# Patient Record
Sex: Female | Born: 1960 | Race: White | Hispanic: No
Health system: Southern US, Community
[De-identification: ages and names within clinical notes are randomized; demographics above are authoritative.]

## PROBLEM LIST (undated history)

## (undated) DIAGNOSIS — E119 Type 2 diabetes mellitus without complications: Secondary | ICD-10-CM

## (undated) DIAGNOSIS — R251 Tremor, unspecified: Secondary | ICD-10-CM

## (undated) DIAGNOSIS — I10 Essential (primary) hypertension: Secondary | ICD-10-CM

## (undated) DIAGNOSIS — F319 Bipolar disorder, unspecified: Secondary | ICD-10-CM

## (undated) HISTORY — PX: LITHOTRIPSY: SUR834

## (undated) HISTORY — PX: STENT PLACEMENT VASCULAR (ARMC HX): HXRAD1737

## (undated) HISTORY — PX: ABDOMINAL HYSTERECTOMY: SHX81

## (undated) HISTORY — PX: CARDIAC SURGERY: SHX584

---

## 2018-03-08 ENCOUNTER — Emergency Department: Payer: Medicare Other

## 2018-03-08 ENCOUNTER — Other Ambulatory Visit: Payer: Self-pay

## 2018-03-08 ENCOUNTER — Emergency Department
Admission: EM | Admit: 2018-03-08 | Discharge: 2018-03-08 | Disposition: A | Discharge status: Home / Self Care | Payer: Medicare Other | Attending: Emergency Medicine | Admitting: Emergency Medicine

## 2018-03-08 ENCOUNTER — Encounter: Payer: Self-pay | Admitting: Emergency Medicine

## 2018-03-08 DIAGNOSIS — E119 Type 2 diabetes mellitus without complications: Secondary | ICD-10-CM | POA: Insufficient documentation

## 2018-03-08 DIAGNOSIS — I10 Essential (primary) hypertension: Secondary | ICD-10-CM | POA: Insufficient documentation

## 2018-03-08 DIAGNOSIS — M545 Low back pain: Secondary | ICD-10-CM | POA: Diagnosis present

## 2018-03-08 DIAGNOSIS — N23 Unspecified renal colic: Secondary | ICD-10-CM | POA: Diagnosis not present

## 2018-03-08 DIAGNOSIS — N390 Urinary tract infection, site not specified: Secondary | ICD-10-CM

## 2018-03-08 DIAGNOSIS — R319 Hematuria, unspecified: Secondary | ICD-10-CM | POA: Diagnosis not present

## 2018-03-08 HISTORY — DX: Type 2 diabetes mellitus without complications: E11.9

## 2018-03-08 HISTORY — DX: Tremor, unspecified: R25.1

## 2018-03-08 HISTORY — DX: Essential (primary) hypertension: I10

## 2018-03-08 HISTORY — DX: Bipolar disorder, unspecified: F31.9

## 2018-03-08 LAB — BASIC METABOLIC PANEL
Anion gap: 13 (ref 5–15)
BUN: 16 mg/dL (ref 6–20)
CO2: 21 mmol/L — AB (ref 22–32)
Calcium: 9.4 mg/dL (ref 8.9–10.3)
Chloride: 106 mmol/L (ref 98–111)
Creatinine, Ser: 0.83 mg/dL (ref 0.44–1.00)
GFR calc Af Amer: >60 mL/min (ref >60)
GFR calc non Af Amer: >60 mL/min (ref >60)
GLUCOSE: 145 mg/dL — AB (ref 70–99)
Potassium: 3.8 mmol/L (ref 3.5–5.1)
Sodium: 140 mmol/L (ref 135–145)

## 2018-03-08 LAB — CBC
HEMATOCRIT: 45.2 % (ref 36.0–46.0)
Hemoglobin: 14.8 g/dL (ref 12.0–15.0)
MCH: 29.2 pg (ref 26.0–34.0)
MCHC: 32.7 g/dL (ref 30.0–36.0)
MCV: 89.2 fL (ref 80.0–100.0)
Platelets: 289 10*3/uL (ref 150–400)
RBC: 5.07 MIL/uL (ref 3.87–5.11)
RDW: 13.2 % (ref 11.5–15.5)
WBC: 12.5 10*3/uL — ABNORMAL HIGH (ref 4.0–10.5)
nRBC: 0 % (ref 0.0–0.2)

## 2018-03-08 LAB — URINALYSIS, COMPLETE (UACMP) WITH MICROSCOPIC
Bilirubin Urine: NEGATIVE
Glucose, UA: NEGATIVE mg/dL
Ketones, ur: NEGATIVE mg/dL
LEUKOCYTES UA: NEGATIVE
Nitrite: NEGATIVE
Protein, ur: 100 mg/dL — AB
RBC / HPF: >50 RBC/hpf — ABNORMAL HIGH (ref 0–5)
Specific Gravity, Urine: 1.02 (ref 1.005–1.030)
pH: 6 (ref 5.0–8.0)

## 2018-03-08 MED ORDER — OXYCODONE-ACETAMINOPHEN 5-325 MG PO TABS
2.0000 | ORAL_TABLET | Freq: Once | ORAL | Status: AC
  Administered 2018-03-08: 2 via ORAL
  Filled 2018-03-08: qty 2

## 2018-03-08 MED ORDER — SODIUM CHLORIDE 0.9 % IV SOLN
Freq: Once | INTRAVENOUS | Status: AC
  Administered 2018-03-08: 21:00:00 via INTRAVENOUS

## 2018-03-08 MED ORDER — OXYCODONE-ACETAMINOPHEN 7.5-325 MG PO TABS: 1.0000 | ORAL_TABLET | ORAL | 0 refills | Status: AC | PRN

## 2018-03-08 MED ORDER — HYDROMORPHONE HCL 1 MG/ML IJ SOLN
1.0000 mg | Freq: Once | INTRAMUSCULAR | Status: AC
  Administered 2018-03-08: 1 mg via INTRAVENOUS
  Filled 2018-03-08: qty 1

## 2018-03-08 MED ORDER — SODIUM CHLORIDE 0.9 % IV SOLN
1.0000 g | Freq: Once | INTRAVENOUS | Status: AC
  Administered 2018-03-08: 1 g via INTRAVENOUS
  Filled 2018-03-08: qty 10

## 2018-03-08 MED ORDER — PROMETHAZINE HCL 25 MG/ML IJ SOLN
25.0000 mg | Freq: Once | INTRAMUSCULAR | Status: AC
  Administered 2018-03-08: 25 mg via INTRAVENOUS
  Filled 2018-03-08: qty 1

## 2018-03-08 MED ORDER — CEPHALEXIN 500 MG PO CAPS: 500.0000 mg | ORAL_CAPSULE | Freq: Four times a day (QID) | ORAL | 0 refills | Status: AC

## 2018-03-08 NOTE — ED Notes (Signed)
Pt states that she has been peeing blood today and she has right sided back pain. Family at bedside.

## 2018-03-08 NOTE — ED Triage Notes (Signed)
PT c/o sudden onset of back pain starting last night with hematuria today. PT hx of kidney stones. Denies fever. PT has hx of tremors.

## 2018-03-08 NOTE — ED Provider Notes (Signed)
[facility], [facility] Emergency Department Provider Note       Time seen: ----------------------------------------- 8:54 PM on 03/08/2018 -----------------------------------------   I have reviewed the triage vital signs and the nursing notes.  HISTORY   Chief Complaint Back Pain    HPI Samantha Ware is a 57 y.o. female with a history of Polar disorder, diabetes, hypertension, tremor who presents to the ED for sudden onset of right-sided low back pain started last night with hematuria today.  She does report a history of kidney stones, this feels more dull and less sharp.  She denies fever or dysuria.  Past Medical History:  Diagnosis Date  . Bipolar 1 disorder (HCC)   . Diabetes mellitus without complication (HCC)   . Hypertension   . Tremor     There are no active problems to display for this patient.   Past Surgical History:  Procedure Laterality Date  . ABDOMINAL HYSTERECTOMY    . CARDIAC SURGERY    . LITHOTRIPSY    . STENT PLACEMENT VASCULAR ([facility] HX)      Allergies Patient has no known allergies.  Social History Social History   Tobacco Use  . Smoking status: Not on file  Substance Use Topics  . Alcohol use: Not Currently  . Drug use: Not Currently   Review of Systems Constitutional: Negative for fever. Cardiovascular: Negative for chest pain. Respiratory: Negative for shortness of breath. Gastrointestinal: Positive for right-sided low back pain, negative for abdominal pain Genitourinary: Positive for hematuria Musculoskeletal: Positive for back pain Skin: Negative for rash. Neurological: Negative for headaches, focal weakness or numbness.  All systems negative/normal/unremarkable except as stated in the HPI  ____________________________________________   PHYSICAL EXAM:  VITAL SIGNS: ED Triage Vitals  Enc Vitals Group     BP 03/08/18 1725 (!) 145/90     Pulse Rate 03/08/18 1725 (!) 112     Resp 03/08/18 1725 18     Temp  03/08/18 1725 98.4 F (36.9 C)     Temp Source 03/08/18 1725 Oral     SpO2 03/08/18 1725 98 %     Weight 03/08/18 1726 (!) 335 lb (152 kg)     Height 03/08/18 1726 5\' 8"  (1.727 m)     Head Circumference --      Peak Flow --      Pain Score 03/08/18 1726 8     Pain Loc --      Pain Edu? --      Excl. in GC? --    Constitutional: Alert and oriented.  Mild distress from pain, morbidly obese Eyes: Conjunctivae are normal. Normal extraocular movements. ENT   Head: Normocephalic and atraumatic.   Nose: No congestion/rhinnorhea.   Mouth/Throat: Mucous membranes are moist.   Neck: No stridor. Cardiovascular: Normal rate, regular rhythm. No murmurs, rubs, or gallops. Respiratory: Normal respiratory effort without tachypnea nor retractions. Breath sounds are clear and equal bilaterally. No wheezes/rales/rhonchi. Gastrointestinal: Soft and nontender. Normal bowel sounds.  Right CVA tenderness Musculoskeletal: Nontender with normal range of motion in extremities. No lower extremity tenderness nor edema. Neurologic:  Normal speech and language. No gross focal neurologic deficits are appreciated.  Skin:  Skin is warm, dry and intact. No rash noted. Psychiatric: Mood and affect are normal. Speech and behavior are normal.  ____________________________________________  ED COURSE:  As part of my medical decision making, I reviewed the following data within the electronic MEDICAL RECORD NUMBER History obtained from family if available, nursing notes, old chart and ekg,  as well as notes from prior ED visits. Patient presented for back pain we will assess with labs and imaging as indicated at this time. Clinical Course as of Mar 09 2231  Wynelle LinkSun Mar 08, 2018  2207 Patient was discussed with urology Dr. Alvester MorinBell, if her pain is improved she can go home and call the office in the morning with antibiotics and pain medicine.   [JW]    Clinical Course User Index [JW] Emily FilbertWilliams, Jaxston Chohan E, MD    Procedures ____________________________________________   LABS (pertinent positives/negatives)  Labs Reviewed  URINALYSIS, COMPLETE (UACMP) WITH MICROSCOPIC - Abnormal; Notable for the following components:      Result Value   Color, Urine AMBER (*)    APPearance CLOUDY (*)    Hgb urine dipstick LARGE (*)    Protein, ur 100 (*)    RBC / HPF >50 (*)    Bacteria, UA MANY (*)    All other components within normal limits  CBC - Abnormal; Notable for the following components:   WBC 12.5 (*)    All other components within normal limits  BASIC METABOLIC PANEL - Abnormal; Notable for the following components:   CO2 21 (*)    Glucose, Bld 145 (*)    All other components within normal limits  URINE CULTURE    RADIOLOGY Images were viewed by me  CT renal protocol IMPRESSION: 1. 6 x 8 mm stone at the right UPJ causes mild fullness in the right intrarenal collecting system. No ureteral or bladder stones. 2. Bilateral nonobstructing renal stones. 3. Cirrhotic changes in the liver. ____________________________________________  DIFFERENTIAL DIAGNOSIS   Renal colic, UTI, pyelonephritis, radicular back pain  FINAL ASSESSMENT AND PLAN  Renal colic, urinary tract infection   Plan: The patient had presented for flank pain. Patient's labs were concerning for possible infection with leukocytosis and blood in urine as well as many bacteria in her urine. Patient's imaging did reveal a 6 x 8 mm stone at the right UPJ.  I discussed with Dr. Alvester MorinBell as dictated above.  She was given IV fluids as well as IV Rocephin.  We sent a urine culture.  She will be discharged with antibiotics and pain medicine and will call the urology office tomorrow.   Ulice DashJohnathan E Ema Hebner, MD   Note: This note was generated in part or whole with voice recognition software. Voice recognition is usually quite accurate but there are transcription errors that can and very often do occur. I apologize for any  typographical errors that were not detected and corrected.     Emily FilbertWilliams, Vikas Wegmann E, MD 03/08/18 2233

## 2018-03-11 LAB — URINE CULTURE
Culture: >=100000 — AB
Special Requests: NORMAL

## 2019-03-19 DEATH — deceased

## 2020-01-02 IMAGING — CT CT RENAL STONE PROTOCOL
3 of 5 series · 16 of 46 positions shown, 18 images · non-contrast
Comparison: None.

CLINICAL DATA: Sudden onset back pain with hematuria.

EXAM:
CT ABDOMEN AND PELVIS WITHOUT CONTRAST
TECHNIQUE: Multidetector CT imaging of the abdomen and pelvis was performed
following the standard protocol without IV contrast.

[Series 2: stone full standard · axial · 0.98mm/px · z∈[+305,+730]mm · 11 of 103 slices shown, 13 images]
[im 9/103  soft-tissue]
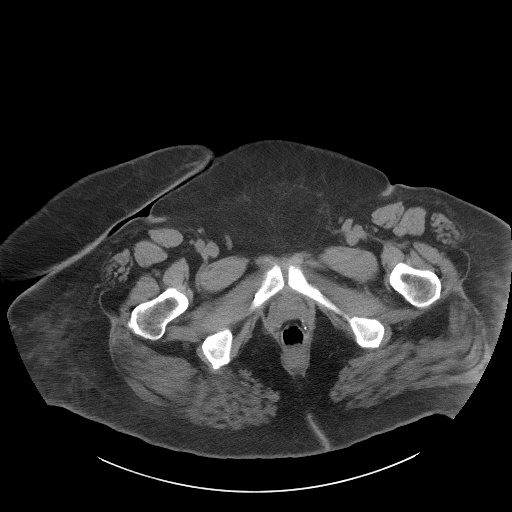
[im 9/103  bone]
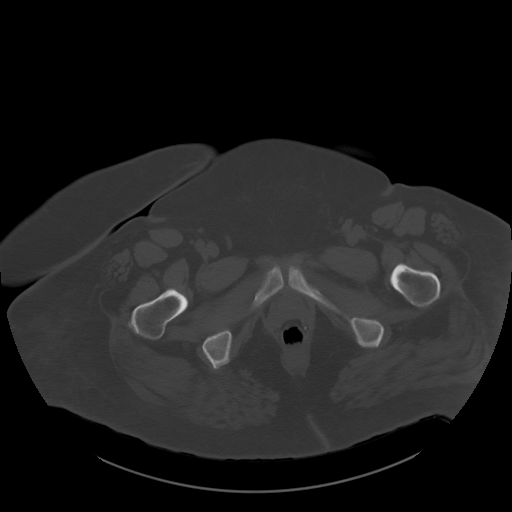
[im 18/103  soft-tissue]
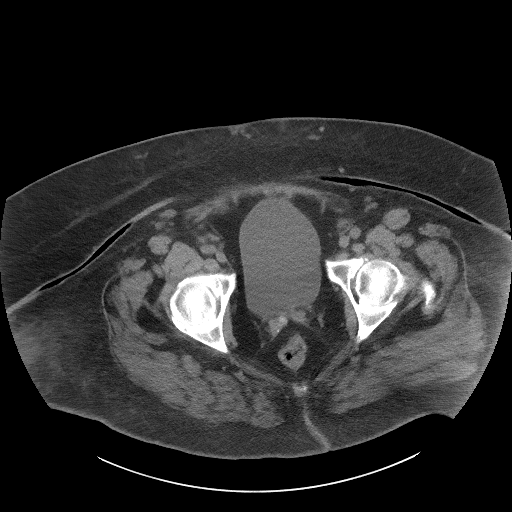
[im 26/103  soft-tissue]
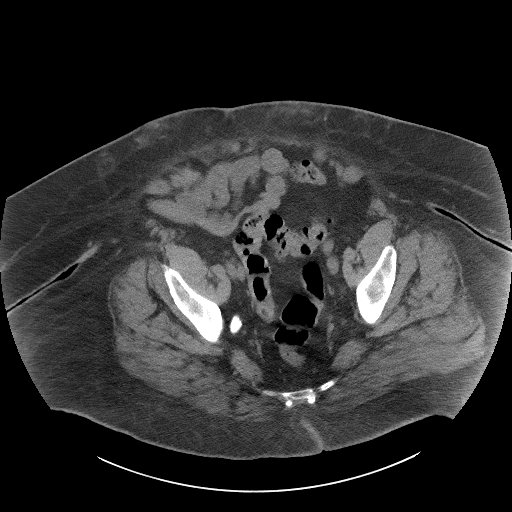
[im 35/103  soft-tissue]
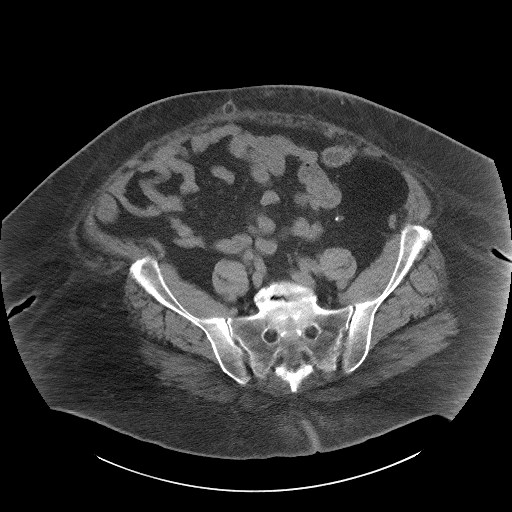
[im 43/103  soft-tissue]
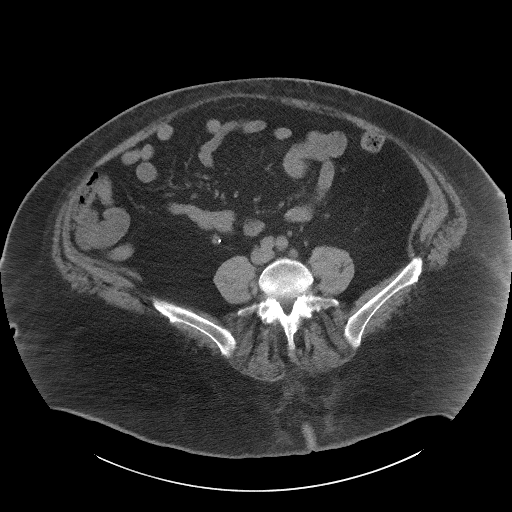
[im 52/103  soft-tissue]
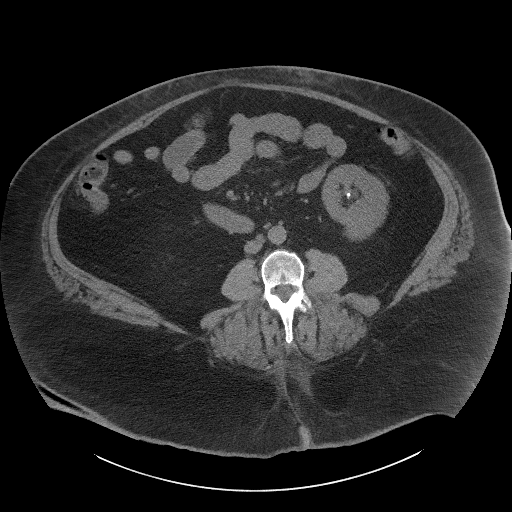
[im 60/103  soft-tissue]
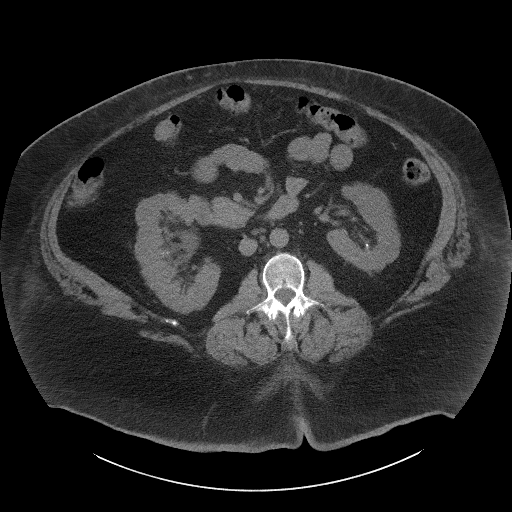
[im 69/103  soft-tissue]
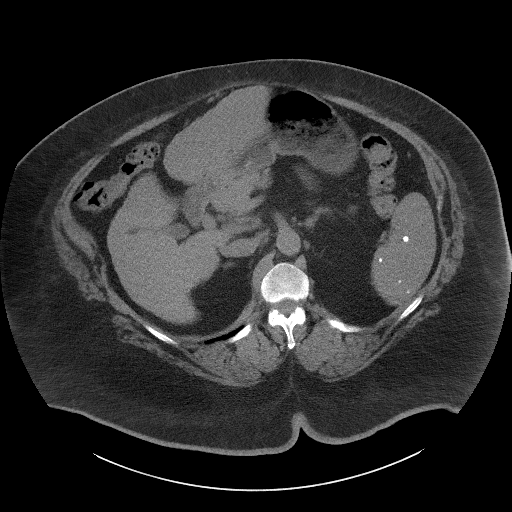
[im 77/103  soft-tissue]
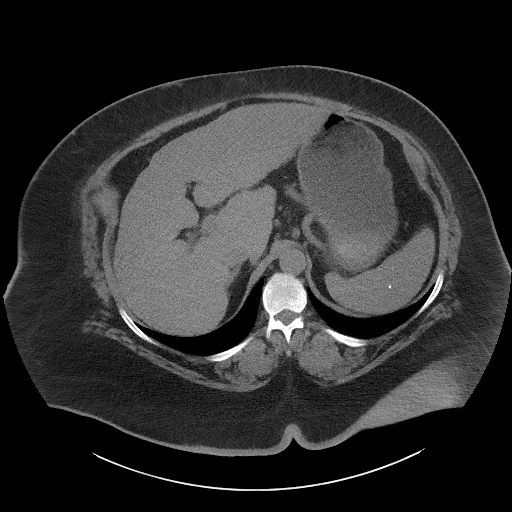
[im 77/103  bone]
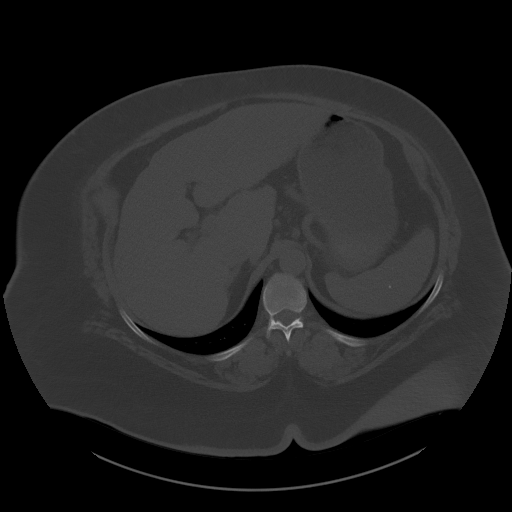
[im 86/103  soft-tissue]
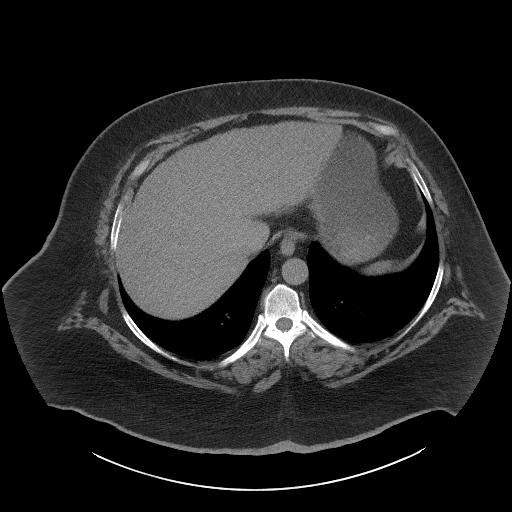
[im 94/103  soft-tissue]
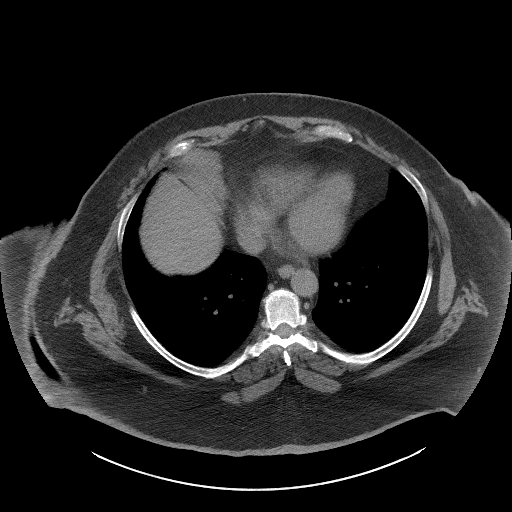

[Series 4: lung bases · axial · 0.98mm/px · z∈[+600,+645]mm · 2 of 44 slices shown]
[im 9/44  bone]
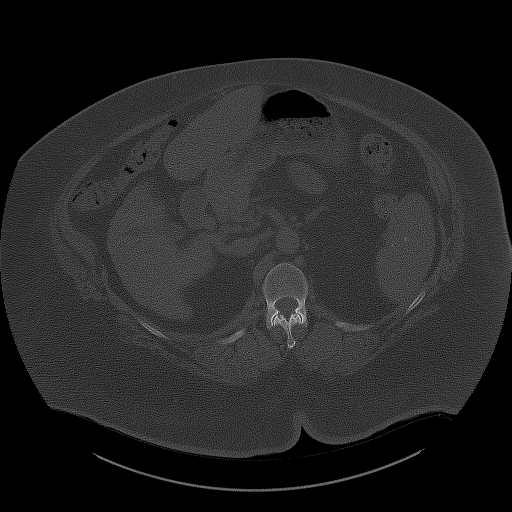
[im 18/44  bone]
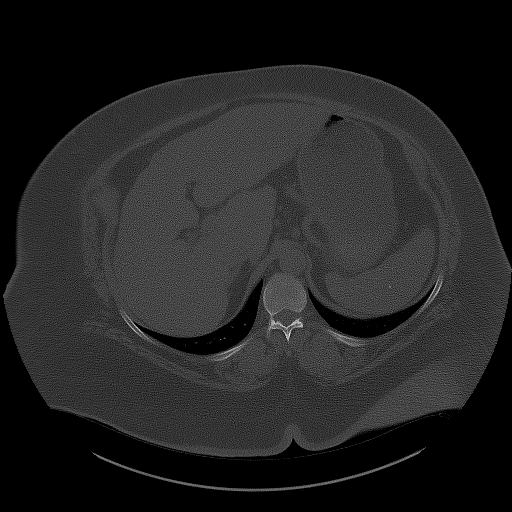

[Series 5: coronal · coronal · 1.00mm/px · 3 of 191 slices shown]
[im 64/191  soft-tissue]
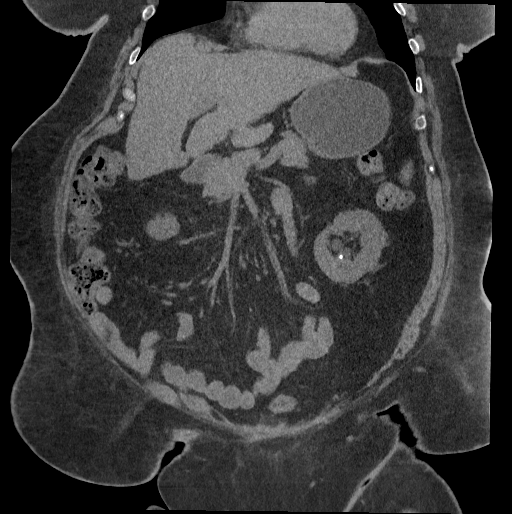
[im 85/191  soft-tissue]
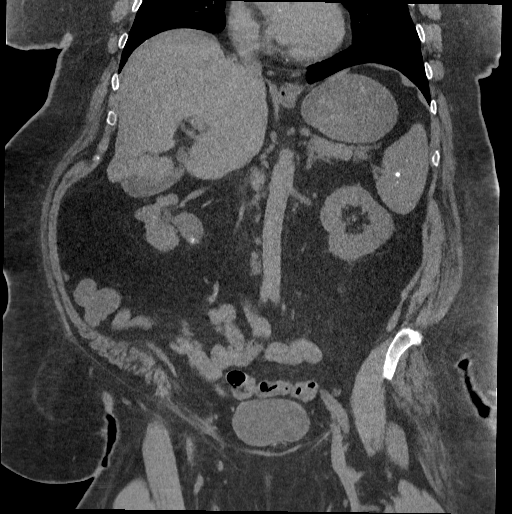
[im 106/191  soft-tissue]
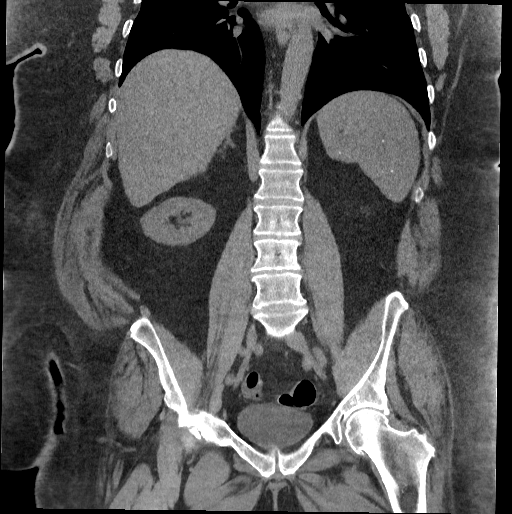

[16 of 46 positions shown; findings below may reference images not displayed]

FINDINGS: Lower chest: Unremarkable.

Hepatobiliary: The liver shows diffusely decreased attenuation
suggesting steatosis. Enlargement of the lateral segment left liver
and caudate lobe is associated with nodular hepatic contour, all
features compatible with cirrhosis. There is no evidence for
gallstones, gallbladder wall thickening, or pericholecystic fluid.
No intrahepatic or extrahepatic biliary dilation.

Pancreas: No focal mass lesion. No dilatation of the main duct. No
intraparenchymal cyst. No peripancreatic edema.

Spleen: Calcified granulomata

Adrenals/Urinary Tract: No adrenal nodule or mass. 2 mm stone
identified lower pole right kidney with 1 mm interpolar stone
evident. 6 x 8 mm stone is identified at the right UPJ in there is
mild fullness of the right intrarenal collecting system. Right
ureter unremarkable.

2 mm nonobstructing stone identified upper pole left kidney and
there is a 3 mm nonobstructing stone in the lower pole left kidney.
No left ureteral stone. No left hydroureteronephrosis.

The urinary bladder appears normal for the degree of distention.

Stomach/Bowel: Stomach is nondistended. No gastric wall thickening.
No evidence of outlet obstruction. Duodenum is normally positioned
as is the ligament of Treitz. No small bowel wall thickening. No
small bowel dilatation. The terminal ileum is normal. The appendix
is normal. No gross colonic mass. No colonic wall thickening.
Diverticular changes are noted in the left colon without evidence of
diverticulitis.

Vascular/Lymphatic: No abdominal aortic aneurysm. Upper normal lymph
nodes in the hepato duodenal ligament are likely related to the
underlying hepatic disease. No retroperitoneal lymphadenopathy. No
pelvic sidewall lymphadenopathy.

Reproductive: Uterus surgically absent.  There is no adnexal mass.

Other: No intraperitoneal free fluid.

Musculoskeletal: Degenerative changes noted right hip. No worrisome
lytic or sclerotic osseous abnormality.
IMPRESSION: 1. 6 x 8 mm stone at the right UPJ causes mild fullness in the right
intrarenal collecting system. No ureteral or bladder stones.
2. Bilateral nonobstructing renal stones.
3. Cirrhotic changes in the liver.
